# Patient Record
Sex: Male | Born: 1995 | Race: White | Hispanic: No | Marital: Single | Smoking: Never smoker
Health system: Southern US, Community
[De-identification: ages and names within clinical notes are randomized; demographics above are authoritative.]

## PROBLEM LIST (undated history)

## (undated) HISTORY — PX: APPENDECTOMY: SHX54

---
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
## 2022-10-08 ENCOUNTER — Ambulatory Visit: Payer: BC Managed Care – PPO | Admitting: Podiatry

## 2022-10-15 ENCOUNTER — Ambulatory Visit (INDEPENDENT_AMBULATORY_CARE_PROVIDER_SITE_OTHER): Payer: BC Managed Care – PPO

## 2022-10-15 ENCOUNTER — Ambulatory Visit: Payer: BC Managed Care – PPO | Admitting: Podiatry

## 2022-10-15 DIAGNOSIS — M778 Other enthesopathies, not elsewhere classified: Secondary | ICD-10-CM | POA: Diagnosis not present

## 2022-10-15 DIAGNOSIS — M79671 Pain in right foot: Secondary | ICD-10-CM | POA: Diagnosis not present

## 2022-10-15 NOTE — Progress Notes (Unsigned)
   Chief Complaint  Patient presents with   Foot Pain    Patient came in today for, lateral side of the right foot, started 4 years ago, rate pain 7 out of 10, Stabbing pain  X-rays done,     HPI: 27 y.o. male presenting today for evaluation of chronic right foot pain.  Patient states that he has been experiencing pain and tenderness to the left foot now for about 4 years.  Gradual onset.  Currently the patient states that the pain is 7/10 almost on a daily basis.  He experiences stabbing pain which is aggravated with walking.  No past medical history on file.  Past Surgical History:  Procedure Laterality Date   APPENDECTOMY      Allergies  Allergen Reactions   Penicillins Hives     Physical Exam: General: The patient is alert and oriented x3 in no acute distress.  Dermatology: Skin is warm, dry and supple bilateral lower extremities. Negative for open lesions or macerations.  Vascular: Palpable pedal pulses bilaterally. Capillary refill within normal limits.  Negative for any significant edema or erythema  Neurological: Light touch and protective threshold grossly intact  Musculoskeletal Exam: No pedal deformities noted.  Significant tenderness with palpation localized around the fourth TMT of the right foot  Radiographic Exam RT foot 10/15/2022:  Normal osseous mineralization.  Irregularity noted to the middle and distal phalanx of the right second toe.  Consistent with patient's given history of prior injury to this area.  Clinically this area is asymptomatic.  No irregularities or fractures noted otherwise  Assessment: 1.  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 Patient evaluated. X-Rays reviewed.  2.  Patient continues to have significant pain and tenderness to this area.  I do believe it is appropriate at this time to order MRI to rule out any soft tissue abnormality or pathology 3.  MRI ordered right foot without contrast 4.   Recommend follow-up in the office after MRI results to review the results and discuss further treatment plan and options      Edrick Kins, DPM [facility]  Dr. Edrick Kins, DPM    [address], [place] [address]                Office [phone]  Fax [phone]

## 2022-11-29 ENCOUNTER — Other Ambulatory Visit: Payer: Self-pay

## 2023-06-10 ENCOUNTER — Ambulatory Visit (INDEPENDENT_AMBULATORY_CARE_PROVIDER_SITE_OTHER): Payer: BC Managed Care – PPO | Admitting: Podiatry

## 2023-06-10 ENCOUNTER — Ambulatory Visit (INDEPENDENT_AMBULATORY_CARE_PROVIDER_SITE_OTHER): Payer: BC Managed Care – PPO

## 2023-06-10 ENCOUNTER — Encounter: Payer: Self-pay | Admitting: Podiatry

## 2023-06-10 DIAGNOSIS — M779 Enthesopathy, unspecified: Secondary | ICD-10-CM

## 2023-06-10 DIAGNOSIS — M778 Other enthesopathies, not elsewhere classified: Secondary | ICD-10-CM | POA: Diagnosis not present

## 2023-06-10 DIAGNOSIS — M722 Plantar fascial fibromatosis: Secondary | ICD-10-CM

## 2023-06-10 MED ORDER — MELOXICAM 15 MG PO TABS: 15.0000 mg | ORAL_TABLET | Freq: Every day | ORAL | 1 refills | Status: AC

## 2023-06-10 MED ORDER — BETAMETHASONE SOD PHOS & ACET 6 (3-3) MG/ML IJ SUSP
3.0000 mg | Freq: Once | INTRAMUSCULAR | Status: AC
  Administered 2023-06-10: 3 mg via INTRA_ARTICULAR

## 2023-06-10 MED ORDER — METHYLPREDNISOLONE 4 MG PO TBPK: ORAL_TABLET | ORAL | 0 refills | Status: AC

## 2023-06-10 NOTE — Progress Notes (Signed)
Chief Complaint  Patient presents with   Foot Pain    "I'm having the same pain in my left foot now too.  So, both hurt now."    HPI: 27 y.o. male presenting today for follow-up evaluation of chronic right foot pain ongoing for about 4 years now.  Patient states that over the past few months he has not developed the exact same pain to the left foot.  He says that the left foot is now more painful than the right.  Idiopathic onset.  Pain can be 7/10 intermittently throughout the day depending on activity.  Last visit MRI was ordered of the right foot however he never had it completed.  Presenting for follow-up treatment evaluation  No past medical history on file.  Past Surgical History:  Procedure Laterality Date   APPENDECTOMY      Allergies  Allergen Reactions   Penicillins Hives     Physical Exam: General: The patient is alert and oriented x3 in no acute distress.  Dermatology: Skin is warm, dry and supple bilateral lower extremities. Negative for open lesions or macerations.  Vascular: Palpable pedal pulses bilaterally. Capillary refill within normal limits.  Negative for any significant edema or erythema  Neurological: Grossly intact via light touch  Musculoskeletal Exam: Tenderness with palpation noted bilateral along the plantar aspect of the foot along the fourth ray just distal to the TMT.  No pain with palpation or range of motion of the MTP.  There is no pain on palpation dorsally along the fourth metatarsal that would be concerning for stress reaction fracture  Radiographic Exam RT foot 10/15/2022:  Normal osseous mineralization.  Irregularity noted to the middle and distal phalanx of the right second toe.  Consistent with patient's given history of prior injury to this area.  Clinically this area is asymptomatic.  No irregularities or fractures noted otherwise  Assessment: 1.  Bursitis/capsulitis plantar aspect of the fourth ray just distal to the TMT  bilateral  Plan of Care:  -Patient evaluated -For now we are going to hold off on the MRI since this is bilateral and I do not suspect that the MRI would show any significant pathology.  He says that he had an MRI performed to the right foot a few years prior which only showed some mild swelling in the area possibly bursitis -Injection 0.5 cc Celestone Soluspan injected along the fourth ray bilateral -Prescription for Medrol Dosepak -Prescription for meloxicam 15 mg daily after completion of the Dosepak -I do believe that custom molded orthotics would help to support the medial longitudinal arch of the foot and equally distribute pressure throughout the foot.  This should help alleviate a lot of his pathology and symptoms that he is experiencing.  Patient states that he has more pain when he wear shoes that do not support his arch -Appointment with orthotics department for custom molded insoles.  Order placed -Return to clinic 4 weeks  *First semester of law school      Felecia Shelling, [place] [facility]  Dr. Felecia Shelling, DPM    [address] [place], [place] [address]                Office [phone]  Fax [phone])  [phone]     

## 2023-07-12 ENCOUNTER — Ambulatory Visit: Payer: BC Managed Care – PPO | Admitting: Podiatry

## 2023-07-12 ENCOUNTER — Encounter: Payer: Self-pay | Admitting: Podiatry

## 2023-07-12 VITALS — Ht 71.0 in | Wt 165.0 lb

## 2023-07-12 DIAGNOSIS — M722 Plantar fascial fibromatosis: Secondary | ICD-10-CM | POA: Diagnosis not present

## 2023-07-12 NOTE — Progress Notes (Signed)
   No chief complaint on file.   HPI: 27 y.o. male presenting today for follow-up evaluation of chronic right foot pain ongoing for about 4 years now.  Injections and anti-inflammatories only helped minimally.  He continues to have global pain that changes locations almost on a daily basis.   Brief history: Idiopathic onset.  Pain can be 7/10 intermittently throughout the day depending on activity.  Last visit MRI was ordered of the right foot however he never had it completed.   No past medical history on file.  Past Surgical History:  Procedure Laterality Date   APPENDECTOMY      Allergies  Allergen Reactions   Penicillins Hives     Physical Exam: General: The patient is alert and oriented x3 in no acute distress.  Dermatology: Skin is warm, dry and supple bilateral lower extremities. Negative for open lesions or macerations.  Vascular: Palpable pedal pulses bilaterally. Capillary refill within normal limits.  Negative for any significant edema or erythema  Neurological: Grossly intact via light touch  Musculoskeletal Exam: There continues to be tenderness with palpation noted bilateral along the plantar aspect of the foot along the fourth ray just distal to the TMT.  No pain with palpation or range of motion of the MTP.  There is no pain on palpation dorsally along the fourth metatarsal that would be concerning for stress reaction fracture  Radiographic Exam RT foot 10/15/2022:  Normal osseous mineralization.  Irregularity noted to the middle and distal phalanx of the right second toe.  Consistent with patient's given history of prior injury to this area.  Clinically this area is asymptomatic.  No irregularities or fractures noted otherwise  Assessment: 1.  Bursitis/capsulitis plantar aspect of the fourth ray just distal to the TMT bilateral  Plan of Care:  -Patient evaluated - Minimal improvement with the injections and anti-inflammatory medication.  Patient states that he  continues to have shifting pain throughout the bilateral feet -Appointment with orthotics department for custom orthotics is pending.  He is going to make a appointment today.  Originally scheduled for 07/22/2023 but our office has to reschedule him.  I hope that the orthotics will help alleviate a lot of the patient's symptoms and pain.  This should help support the medial longitudinal arch of the foot and stabilize the foot during ambulation  *First semester of law school      Felecia Shelling, DPM [facility]  Dr. Felecia Shelling, DPM    [address] [place], [place] [address]                Office [phone]  Fax [phone]

## 2023-07-22 ENCOUNTER — Other Ambulatory Visit: Payer: BC Managed Care – PPO

## 2023-10-07 ENCOUNTER — Ambulatory Visit: Payer: 59

## 2023-10-07 NOTE — Progress Notes (Signed)
 Orthotics   Patient was present and evaluated for Custom molded foot orthotics. Patient will benefit from CFO's to provide total contact to BIL MLA's helping to balance and distribute body weight more evenly across BIL feet helping to reduce plantar pressure and pain. Orthotic will also encourage FF / RF alignment  Patient was scanned today and will return for fitting upon receipt

## 2023-10-19 ENCOUNTER — Telehealth: Payer: Self-pay

## 2023-10-19 NOTE — Telephone Encounter (Signed)
 Called PT could not leave VM, called to reschedule appt on 4/18 bc we are closed that day.

## 2023-10-20 ENCOUNTER — Telehealth: Payer: Self-pay

## 2023-10-20 NOTE — Telephone Encounter (Signed)
 Will be taking orthos to [facility] 3/7

## 2023-12-02 ENCOUNTER — Other Ambulatory Visit: Payer: 59

## 2023-12-13 ENCOUNTER — Ambulatory Visit (INDEPENDENT_AMBULATORY_CARE_PROVIDER_SITE_OTHER): Admitting: Podiatry

## 2023-12-13 DIAGNOSIS — M722 Plantar fascial fibromatosis: Secondary | ICD-10-CM

## 2023-12-13 NOTE — Progress Notes (Signed)
 Orthotics are dispensed and functioning well no acute complaints.  They are fitting well break-in period was discussed
